# Patient Record
Sex: Female | Born: 2005
Health system: Southern US, Community
[De-identification: ages and names within clinical notes are randomized; demographics above are authoritative.]

## PROBLEM LIST (undated history)

## (undated) DIAGNOSIS — K5900 Constipation, unspecified: Secondary | ICD-10-CM

## (undated) DIAGNOSIS — J45909 Unspecified asthma, uncomplicated: Secondary | ICD-10-CM

## (undated) DIAGNOSIS — N920 Excessive and frequent menstruation with regular cycle: Secondary | ICD-10-CM

## (undated) DIAGNOSIS — Z789 Other specified health status: Secondary | ICD-10-CM

## (undated) DIAGNOSIS — T4145XA Adverse effect of unspecified anesthetic, initial encounter: Secondary | ICD-10-CM

## (undated) DIAGNOSIS — N83201 Unspecified ovarian cyst, right side: Secondary | ICD-10-CM

## (undated) DIAGNOSIS — Z973 Presence of spectacles and contact lenses: Secondary | ICD-10-CM

## (undated) DIAGNOSIS — T8859XA Other complications of anesthesia, initial encounter: Secondary | ICD-10-CM

## (undated) DIAGNOSIS — Z464 Encounter for fitting and adjustment of orthodontic device: Secondary | ICD-10-CM

## (undated) DIAGNOSIS — J302 Other seasonal allergic rhinitis: Secondary | ICD-10-CM

## (undated) DIAGNOSIS — Z8489 Family history of other specified conditions: Secondary | ICD-10-CM

## (undated) DIAGNOSIS — R7989 Other specified abnormal findings of blood chemistry: Secondary | ICD-10-CM

## (undated) HISTORY — PX: MULTIPLE TOOTH EXTRACTIONS: SHX2053

---
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
## 2024-06-05 ENCOUNTER — Other Ambulatory Visit: Payer: Self-pay | Admitting: Otolaryngology

## 2024-06-06 ENCOUNTER — Ambulatory Visit: Admit: 2024-06-06 | Admitting: Otolaryngology

## 2024-06-06 LAB — SURGICAL PATHOLOGY

## 2024-06-06 SURGERY — SINUS SURGERY, ENDOSCOPIC
Anesthesia: General | Laterality: Bilateral

## 2024-07-20 ENCOUNTER — Other Ambulatory Visit (HOSPITAL_BASED_OUTPATIENT_CLINIC_OR_DEPARTMENT_OTHER): Payer: Self-pay

## 2024-07-20 MED ORDER — DAPSONE 5 % EX GEL
CUTANEOUS | 2 refills | Status: AC
  Filled 2024-07-20 (×5): qty 60, 30d supply, fill #0
